# Patient Record
Sex: Female | Born: 1965 | Race: Black or African American | Hispanic: No | Marital: Married | Smoking: Current every day smoker
Health system: Southern US, Community
[De-identification: ages and names within clinical notes are randomized; demographics above are authoritative.]

## PROBLEM LIST (undated history)

## (undated) DIAGNOSIS — J40 Bronchitis, not specified as acute or chronic: Secondary | ICD-10-CM

## (undated) HISTORY — PX: TUBAL LIGATION: SHX77

---

## 2010-02-28 ENCOUNTER — Ambulatory Visit: Payer: Self-pay | Admitting: Diagnostic Radiology

## 2010-02-28 ENCOUNTER — Emergency Department (HOSPITAL_BASED_OUTPATIENT_CLINIC_OR_DEPARTMENT_OTHER): Admission: EM | Admit: 2010-02-28 | Discharge: 2010-02-28 | Payer: Self-pay | Admitting: Emergency Medicine

## 2011-05-10 ENCOUNTER — Emergency Department (INDEPENDENT_AMBULATORY_CARE_PROVIDER_SITE_OTHER): Payer: BC Managed Care – PPO

## 2011-05-10 ENCOUNTER — Emergency Department (HOSPITAL_BASED_OUTPATIENT_CLINIC_OR_DEPARTMENT_OTHER)
Admission: EM | Admit: 2011-05-10 | Discharge: 2011-05-10 | Disposition: A | Discharge status: Home / Self Care | Payer: BC Managed Care – PPO | Attending: Emergency Medicine | Admitting: Emergency Medicine

## 2011-05-10 ENCOUNTER — Encounter: Payer: Self-pay | Admitting: *Deleted

## 2011-05-10 DIAGNOSIS — E119 Type 2 diabetes mellitus without complications: Secondary | ICD-10-CM | POA: Insufficient documentation

## 2011-05-10 DIAGNOSIS — R059 Cough, unspecified: Secondary | ICD-10-CM

## 2011-05-10 DIAGNOSIS — R0602 Shortness of breath: Secondary | ICD-10-CM

## 2011-05-10 DIAGNOSIS — R05 Cough: Secondary | ICD-10-CM

## 2011-05-10 DIAGNOSIS — J40 Bronchitis, not specified as acute or chronic: Secondary | ICD-10-CM

## 2011-05-10 MED ORDER — AZITHROMYCIN 250 MG PO TABS
500.0000 mg | ORAL_TABLET | Freq: Once | ORAL | Status: AC
  Administered 2011-05-10: 500 mg via ORAL
  Filled 2011-05-10: qty 2

## 2011-05-10 MED ORDER — AZITHROMYCIN 250 MG PO TABS: ORAL_TABLET | ORAL | Status: AC

## 2011-05-10 MED ORDER — ALBUTEROL SULFATE HFA 108 (90 BASE) MCG/ACT IN AERS: 2.0000 | INHALATION_SPRAY | RESPIRATORY_TRACT | Status: AC | PRN

## 2011-05-10 MED ORDER — IPRATROPIUM BROMIDE 0.02 % IN SOLN
0.5000 mg | Freq: Once | RESPIRATORY_TRACT | Status: AC
  Administered 2011-05-10: 0.5 mg via RESPIRATORY_TRACT

## 2011-05-10 MED ORDER — ALBUTEROL SULFATE (5 MG/ML) 0.5% IN NEBU
5.0000 mg | INHALATION_SOLUTION | Freq: Once | RESPIRATORY_TRACT | Status: AC
  Administered 2011-05-10: 5 mg via RESPIRATORY_TRACT

## 2011-05-10 MED ORDER — IPRATROPIUM BROMIDE 0.02 % IN SOLN
RESPIRATORY_TRACT | Status: AC
  Administered 2011-05-10: 0.5 mg via RESPIRATORY_TRACT
  Filled 2011-05-10: qty 2.5

## 2011-05-10 MED ORDER — ALBUTEROL SULFATE (5 MG/ML) 0.5% IN NEBU
INHALATION_SOLUTION | RESPIRATORY_TRACT | Status: AC
  Filled 2011-05-10: qty 1

## 2011-05-10 NOTE — ED Provider Notes (Addendum)
History     CSN: [id number] Arrival date & time: 05/10/2011  7:14 AM   First MD Initiated Contact with Patient 05/10/11 [phone]      Chief Complaint  Patient presents with  . Shortness of Breath  . Cough    (Consider location/radiation/quality/duration/timing/severity/associated sxs/prior treatment) Patient is a 45 y.o. female presenting with shortness of breath and cough. The history is provided by the patient.  Shortness of Breath  Associated symptoms include cough and shortness of breath. Pertinent negatives include no chest pain and no fever.  Cough Associated symptoms include shortness of breath. Pertinent negatives include no chest pain, no headaches and no eye redness.  pt c/o sob and non prod cough x 1 week. Sob worse w coughing spells. No chest pain or discomfort. No sore throat, nasal congestion or other uri c/o. +smoker. No hx asthma or copd. No hx chf. No orthopnea/pna. No fever or chills. No leg pain or swelling. No dvt or pe hx. No fam hx premature cad. No assoc nv, or diaphoresis.   Past Medical History  Diagnosis Date  . Diabetes mellitus     Past Surgical History  Procedure Date  . Tubal ligation     No family history on file.  History  Substance Use Topics  . Smoking status: Not on file  . Smokeless tobacco: Not on file  . Alcohol Use: Yes    OB History    Grav Para Term Preterm Abortions TAB SAB Ect Mult Living                  Review of Systems  Constitutional: Negative for fever.  HENT: Negative for neck pain.   Eyes: Negative for redness.  Respiratory: Positive for cough and shortness of breath.   Cardiovascular: Negative for chest pain, palpitations and leg swelling.  Gastrointestinal: Negative for abdominal pain.  Genitourinary: Negative for dysuria.  Musculoskeletal: Negative for back pain.  Skin: Negative for rash.  Neurological: Negative for headaches.  Hematological: Does not bruise/bleed easily.  Psychiatric/Behavioral: Negative  for confusion.    Allergies  Review of patient's allergies indicates not on file.  Home Medications  No current outpatient prescriptions on file.  BP 156/80  Pulse 104  Temp(Src) 97.8 F (36.6 C) (Oral)  Resp 20  SpO2 99%  Physical Exam  Nursing note and vitals reviewed. Constitutional: She appears well-developed and well-nourished. No distress.  HENT:  Mouth/Throat: Oropharynx is clear and moist.  Eyes: Conjunctivae are normal. No scleral icterus.  Neck: Neck supple. No tracheal deviation present.  Cardiovascular: Normal rate, regular rhythm and intact distal pulses.  Exam reveals no gallop and no friction rub.   No murmur heard. Pulmonary/Chest: Effort normal. No respiratory distress.       Faint exp wheeze  Abdominal: Soft. Normal appearance. She exhibits no distension. There is no tenderness.  Musculoskeletal: She exhibits no edema and no tenderness.  Neurological: She is alert.  Skin: Skin is warm and dry. No rash noted.  Psychiatric: She has a normal mood and affect.    ED Course  Procedures (including critical care time)  Labs Reviewed - No data to display No results found.  No results found for this or any previous visit. Dg Chest 2 View  05/10/2011  *RADIOLOGY REPORT*  Clinical Data: Cough, shortness of breath  CHEST - 2 VIEW  Comparison: None.  Findings: Prominent heart size but normal vascularity.  Central bronchial thickening noted with perihilar and lower lobe ill- defined streaky interstitial opacities,  suspect related to chronic bronchitis versus bronchopneumonia or atypical pneumonia.  No effusions or pneumothorax.  Trachea is midline.  10 mm nodular density projects in the right upper chest along the anterior first rib, asymmetric compared to the left.  This could represent superimposed shadows versus a lung nodule.  No available comparison studies to document stability.  Also, there is a well-circumscribed 6 mm nodule in the right lower lobe medially which  could present a prominent vascular marking versus a granuloma.  No acute osseous finding.  IMPRESSION: Perihilar and bibasilar streaky interstitial opacities, suspect chronic bronchitis versus bronchopneumonia or atypical pneumonia.  Right upper chest 10 mm nodular density versus lung nodule. Recommend short-term non emergent follow-up chest CT.  Right lower lobe prominent vascular marking versus granuloma.  Original Report Authenticated By: Judie Petit. Ruel Favors, M.D.     No diagnosis found.    MDM  Nursing notes reviewed. Cxr. Albuterol and atrovent neb tx.     Recheck no increased wob. No wheezing.   Discussed xray with patient including right upper lung nodule/density and the need to follow up with primary care doctor in next couple weeks for outpt ct and further evaluation to exclude mass/cancer.     Suzi Roots, MD 05/10/11 1610  Suzi Roots, MD 05/10/11 [phone]

## 2011-05-10 NOTE — ED Notes (Signed)
Pt reports shortness of breath and productive cough for one week, worse today.

## 2011-05-11 ENCOUNTER — Emergency Department (HOSPITAL_BASED_OUTPATIENT_CLINIC_OR_DEPARTMENT_OTHER)
Admission: EM | Admit: 2011-05-11 | Discharge: 2011-05-11 | Disposition: A | Discharge status: Home / Self Care | Payer: BC Managed Care – PPO | Attending: Emergency Medicine | Admitting: Emergency Medicine

## 2011-05-11 ENCOUNTER — Encounter (HOSPITAL_BASED_OUTPATIENT_CLINIC_OR_DEPARTMENT_OTHER): Payer: Self-pay | Admitting: Emergency Medicine

## 2011-05-11 DIAGNOSIS — E119 Type 2 diabetes mellitus without complications: Secondary | ICD-10-CM | POA: Insufficient documentation

## 2011-05-11 DIAGNOSIS — R05 Cough: Secondary | ICD-10-CM | POA: Insufficient documentation

## 2011-05-11 DIAGNOSIS — R0602 Shortness of breath: Secondary | ICD-10-CM | POA: Insufficient documentation

## 2011-05-11 DIAGNOSIS — R059 Cough, unspecified: Secondary | ICD-10-CM | POA: Insufficient documentation

## 2011-05-11 DIAGNOSIS — J40 Bronchitis, not specified as acute or chronic: Secondary | ICD-10-CM | POA: Insufficient documentation

## 2011-05-11 MED ORDER — ALBUTEROL SULFATE (5 MG/ML) 0.5% IN NEBU
5.0000 mg | INHALATION_SOLUTION | Freq: Once | RESPIRATORY_TRACT | Status: AC
  Administered 2011-05-11: 5 mg via RESPIRATORY_TRACT
  Filled 2011-05-11: qty 1

## 2011-05-11 NOTE — ED Provider Notes (Signed)
History     CSN: [id number] Arrival date & time: 05/11/2011 12:23 AM   First MD Initiated Contact with Patient 05/11/11 0101      Chief Complaint  Patient presents with  . Cough  . Shortness of Breath    (Consider location/radiation/quality/duration/timing/severity/associated sxs/prior treatment) HPI Comments: Pleasant 45 year old female with history of recent visit for bronchitis presents with ongoing shortness of breath, cough. She states that this was gradual onset, persistent over the week, much better during the day but gets worse at night when she lays down to go to sleep. She denies swelling, fever, vomiting but has a persistent productive cough. She was seen in the last 24 hours and given Zithromax, albuterol inhaler and encouraged to use a cough medicine which she states she has been using but has not seen significant improvement at this time. Currently symptoms are moderate, nothing makes better but seems to be worse at night. She states that she continues to smoke cigarettes  Patient is a 45 y.o. female presenting with cough and shortness of breath. The history is provided by the patient, the spouse and medical records.  Cough Associated symptoms include shortness of breath.  Shortness of Breath  Associated symptoms include cough and shortness of breath.    Past Medical History  Diagnosis Date  . Diabetes mellitus     Past Surgical History  Procedure Date  . Tubal ligation     No family history on file.  History  Substance Use Topics  . Smoking status: Not on file  . Smokeless tobacco: Not on file  . Alcohol Use: Yes    OB History    Grav Para Term Preterm Abortions TAB SAB Ect Mult Living                  Review of Systems  Respiratory: Positive for cough and shortness of breath.   All other systems reviewed and are negative.    Allergies  Review of patient's allergies indicates no known allergies.  Home Medications   Current Outpatient Rx    Name Route Sig Dispense Refill  . ALBUTEROL SULFATE HFA 108 (90 BASE) MCG/ACT IN AERS Inhalation Inhale 2 puffs into the lungs every 4 (four) hours as needed for wheezing. 1 Inhaler 3  . AZITHROMYCIN 250 MG PO TABS  Take as directed 1 each 0    BP 167/84  Pulse 101  Temp(Src) 97.8 F (36.6 C) (Oral)  Resp 20  SpO2 100%  LMP 04/17/2011  Physical Exam  Nursing note and vitals reviewed. Constitutional: She appears well-developed and well-nourished. No distress.  HENT:  Head: Normocephalic and atraumatic.  Mouth/Throat: Oropharynx is clear and moist. No oropharyngeal exudate.  Eyes: Conjunctivae and EOM are normal. Pupils are equal, round, and reactive to light. Right eye exhibits no discharge. Left eye exhibits no discharge. No scleral icterus.  Neck: Normal range of motion. Neck supple. No JVD present. No thyromegaly present.  Cardiovascular: Normal rate, regular rhythm, normal heart sounds and intact distal pulses.  Exam reveals no gallop and no friction rub.   No murmur heard. Pulmonary/Chest: Effort normal and breath sounds normal. No respiratory distress. She has no wheezes. She has no rales.  Abdominal: Soft. Bowel sounds are normal. She exhibits no distension and no mass. There is no tenderness.  Musculoskeletal: Normal range of motion. She exhibits no edema and no tenderness.  Lymphadenopathy:    She has no cervical adenopathy.  Neurological: She is alert. Coordination normal.  Skin: Skin is  warm and dry. No rash noted. No erythema.  Psychiatric: She has a normal mood and affect. Her behavior is normal.    ED Course  Procedures (including critical care time)    1. Bronchitis       MDM  No abnormal lung findings, oxygen saturation 100% on room air, otherwise patient appears well though she is having cyclic coughing fits. She is on the appropriate medications for home but would benefit from an albuterol nebulizer treatment here. I again encouraged close followup for  abnormal chest x-ray findings and ongoing bronchitis symptoms.        Vida Roller, MD 05/11/11 [phone]

## 2011-05-11 NOTE — ED Notes (Signed)
Pt was here already for the same chief complaint of bronchitis. Pt was given an HHN tx, had a chest X-Ray and sent home with MDI (Albuterol) and some antibiotics but state that they are not working and is here for the same thing and states that she still feels [facility] and RX is not working for her none of them.

## 2011-05-11 NOTE — ED Notes (Signed)
Pt c/o cough and shob. Pt states she can't sleep tonight due to shob, however family reports pt slept all day without difficulty. Pt was seen here this morning for same and given zithromax and albuterol inhaler.

## 2011-08-15 ENCOUNTER — Other Ambulatory Visit: Payer: Self-pay

## 2011-08-15 ENCOUNTER — Emergency Department (INDEPENDENT_AMBULATORY_CARE_PROVIDER_SITE_OTHER): Payer: BC Managed Care – PPO

## 2011-08-15 ENCOUNTER — Encounter (HOSPITAL_BASED_OUTPATIENT_CLINIC_OR_DEPARTMENT_OTHER): Payer: Self-pay | Admitting: *Deleted

## 2011-08-15 ENCOUNTER — Ambulatory Visit (HOSPITAL_BASED_OUTPATIENT_CLINIC_OR_DEPARTMENT_OTHER): Admission: RE | Admit: 2011-08-15 | Payer: BC Managed Care – PPO | Source: Ambulatory Visit

## 2011-08-15 ENCOUNTER — Emergency Department (HOSPITAL_BASED_OUTPATIENT_CLINIC_OR_DEPARTMENT_OTHER)
Admission: EM | Admit: 2011-08-15 | Discharge: 2011-08-15 | Disposition: A | Discharge status: Home / Self Care | Payer: BC Managed Care – PPO | Attending: Emergency Medicine | Admitting: Emergency Medicine

## 2011-08-15 DIAGNOSIS — R0602 Shortness of breath: Secondary | ICD-10-CM | POA: Insufficient documentation

## 2011-08-15 DIAGNOSIS — M79609 Pain in unspecified limb: Secondary | ICD-10-CM

## 2011-08-15 DIAGNOSIS — E119 Type 2 diabetes mellitus without complications: Secondary | ICD-10-CM | POA: Insufficient documentation

## 2011-08-15 DIAGNOSIS — M7989 Other specified soft tissue disorders: Secondary | ICD-10-CM | POA: Insufficient documentation

## 2011-08-15 DIAGNOSIS — R799 Abnormal finding of blood chemistry, unspecified: Secondary | ICD-10-CM

## 2011-08-15 DIAGNOSIS — E010 Iodine-deficiency related diffuse (endemic) goiter: Secondary | ICD-10-CM

## 2011-08-15 DIAGNOSIS — R918 Other nonspecific abnormal finding of lung field: Secondary | ICD-10-CM | POA: Insufficient documentation

## 2011-08-15 DIAGNOSIS — E049 Nontoxic goiter, unspecified: Secondary | ICD-10-CM | POA: Insufficient documentation

## 2011-08-15 DIAGNOSIS — R609 Edema, unspecified: Secondary | ICD-10-CM | POA: Insufficient documentation

## 2011-08-15 DIAGNOSIS — D649 Anemia, unspecified: Secondary | ICD-10-CM | POA: Insufficient documentation

## 2011-08-15 HISTORY — DX: Bronchitis, not specified as acute or chronic: J40

## 2011-08-15 LAB — DIFFERENTIAL
Basophils Absolute: 0.1 10*3/uL (ref 0.0–0.1)
Eosinophils Absolute: 0.1 10*3/uL (ref 0.0–0.7)
Lymphs Abs: 1.9 10*3/uL (ref 0.7–4.0)
Monocytes Absolute: 0.4 10*3/uL (ref 0.1–1.0)
Neutrophils Relative %: 56 % (ref 43–77)

## 2011-08-15 LAB — BASIC METABOLIC PANEL
BUN: 19 mg/dL (ref 6–23)
Calcium: 9.4 mg/dL (ref 8.4–10.5)
Creatinine, Ser: 0.9 mg/dL (ref 0.50–1.10)
GFR calc Af Amer: 87 mL/min — ABNORMAL LOW (ref >90)
GFR calc non Af Amer: 75 mL/min — ABNORMAL LOW (ref >90)
Glucose, Bld: 170 mg/dL — ABNORMAL HIGH (ref 70–99)
Potassium: 4 mEq/L (ref 3.5–5.1)

## 2011-08-15 LAB — CBC
MCH: 19.6 pg — ABNORMAL LOW (ref 26.0–34.0)
MCHC: 28.8 g/dL — ABNORMAL LOW (ref 30.0–36.0)
MCV: 68 fL — ABNORMAL LOW (ref 78.0–100.0)
Platelets: 481 10*3/uL — ABNORMAL HIGH (ref 150–400)
RBC: 4.03 MIL/uL (ref 3.87–5.11)
RDW: 17.1 % — ABNORMAL HIGH (ref 11.5–15.5)

## 2011-08-15 MED ORDER — IOHEXOL 350 MG/ML SOLN
80.0000 mL | Freq: Once | INTRAVENOUS | Status: AC | PRN
  Administered 2011-08-15: 80 mL via INTRAVENOUS

## 2011-08-15 MED ORDER — ALBUTEROL SULFATE HFA 108 (90 BASE) MCG/ACT IN AERS
INHALATION_SPRAY | RESPIRATORY_TRACT | Status: AC
  Administered 2011-08-15: 05:00:00
  Filled 2011-08-15: qty 6.7

## 2011-08-15 NOTE — ED Provider Notes (Signed)
History     CSN: [id number]  Arrival date & time 08/15/11  0102   First MD Initiated Contact with Patient 08/15/11 0114      Chief Complaint  Patient presents with  . Leg Pain  . Shortness of Breath    (Consider location/radiation/quality/duration/timing/severity/associated sxs/prior treatment) HPI  Patient complaining of shortness of breath for 2 weeks. She states that this comes and goes and seems to be worse with exertion. She states that she has had treatment from here with an inhaler for bronchitis. She quit smoking in November. She has not had any cough, fever, or chest pain. Patient also complains of some pain and swelling in her right lower extremity. She states this has been present for a week. She denies any acute onset of this. She states she has not had any blood clots in the past. She denies history of trauma or travel.  Past Medical History  Diagnosis Date  . Diabetes mellitus   . Bronchitis     Past Surgical History  Procedure Date  . Tubal ligation   . Tubal ligation   . Cesarean section     No family history on file.  History  Substance Use Topics  . Smoking status: Former Smoker -- 1.0 packs/day    Quit date: 05/10/2011  . Smokeless tobacco: Never Used  . Alcohol Use: Yes    OB History    Grav Para Term Preterm Abortions TAB SAB Ect Mult Living                  Review of Systems  All other systems reviewed and are negative.    Allergies  Review of patient's allergies indicates no known allergies.  Home Medications   Current Outpatient Rx  Name Route Sig Dispense Refill  . IBUPROFEN 200 MG PO TABS Oral Take 800 mg by mouth every 6 (six) hours as needed.    . ALBUTEROL SULFATE HFA 108 (90 BASE) MCG/ACT IN AERS Inhalation Inhale 2 puffs into the lungs every 4 (four) hours as needed for wheezing. 1 Inhaler 3    BP 176/86  Pulse 102  Temp(Src) 98.4 F (36.9 C) (Oral)  Resp 20  Ht 5\' 4"  (1.626 m)  Wt 175 lb (79.379 kg)  BMI 30.04  kg/m2  SpO2 100%  LMP 08/01/2011  Physical Exam  Nursing note and vitals reviewed. Constitutional: She is oriented to person, place, and time. She appears well-developed and well-nourished.  HENT:  Head: Normocephalic and atraumatic.  Right Ear: External ear normal.  Left Ear: External ear normal.  Nose: Nose normal.  Mouth/Throat: Oropharynx is clear and moist.  Eyes: Conjunctivae and EOM are normal. Pupils are equal, round, and reactive to light.  Neck: Normal range of motion. Neck supple.  Cardiovascular: Normal rate, regular rhythm and normal heart sounds.   Pulmonary/Chest: Effort normal and breath sounds normal.  Abdominal: Soft. Bowel sounds are normal.  Musculoskeletal:       Bilateral ankle edema. No tenderness or warmth noted of either half or knee or thigh.  Neurological: She is alert and oriented to person, place, and time. She has normal reflexes.  Skin: Skin is warm and dry.  Psychiatric: She has a normal mood and affect.    ED Course  Procedures (including critical care time)  Date: 08/15/2011  Rate: 98  Rhythm: normal sinus rhythm  QRS Axis: normal  Intervals: normal  ST/T Wave abnormalities: normal  Conduction Disutrbances:none  Narrative Interpretation:   Old EKG Reviewed:  none available   Labs Reviewed  CBC - Abnormal; Notable for the following:    Hemoglobin 7.9 (*) REPEATED TO VERIFY   HCT 27.4 (*)    MCV 68.0 (*)    MCH 19.6 (*)    MCHC 28.8 (*)    RDW 17.1 (*)    Platelets 481 (*)    All other components within normal limits  BASIC METABOLIC PANEL - Abnormal; Notable for the following:    Glucose, Bld 170 (*)    GFR calc non Af Amer 75 (*)    GFR calc Af Amer 87 (*)    All other components within normal limits  D-DIMER, QUANTITATIVE - Abnormal; Notable for the following:    D-Dimer, Quant 0.84 (*)    All other components within normal limits  DIFFERENTIAL   Dg Chest 2 View  08/15/2011  *RADIOLOGY REPORT*  Clinical Data: Shortness of  breath; leg pain.  History of diabetes.  CHEST - 2 VIEW  Comparison: Chest radiograph performed 05/10/2011  Findings: The lungs are well-aerated.  Mild right perihilar opacity could reflect a mild infectious process, similar in appearance to the prior study.  Mild asymmetric edema could have a similar appearance.  There is no evidence of pleural effusion or pneumothorax.  The heart is normal in size; the mediastinal contour is within normal limits.  No acute osseous abnormalities are seen.  IMPRESSION: Mild right perihilar opacity could reflect a persistent mild infectious process; this is similar in appearance to the prior study.  Mild asymmetric edema could have a similar appearance.  Original Report Authenticated By: Tonia Ghent, M.D.   Ct Angio Chest W/cm &/or Wo Cm  08/15/2011  *RADIOLOGY REPORT*  Clinical Data: Shortness of breath, leg pain and elevated D-dimer. Leg swelling.  History of diabetes.  CT ANGIOGRAPHY CHEST  Technique:  Multidetector CT imaging of the chest using the standard protocol during bolus administration of intravenous contrast. Multiplanar reconstructed images including MIPs were obtained and reviewed to evaluate the vascular anatomy.  Contrast: 80mL OMNIPAQUE IOHEXOL 350 MG/ML IV SOLN  Comparison: Chest radiograph performed earlier today at 01:53 a.m.  Findings: There is no evidence of pulmonary embolus.  There is mild interstitial prominence at the lung bases, without significant pulmonary edema.  The lungs are otherwise grossly clear.  There is no evidence of significant focal consolidation, pleural effusion or pneumothorax.  No masses are identified; no abnormal focal contrast enhancement is seen.  Prominent right hilar and subcarinal nodes are seen, measuring up to 1.6 cm in short axis.  These are difficult to distinguish from surrounding soft tissues.  Smaller superior mediastinal nodes appear normal in size.  No axillary lymphadenopathy is seen.  There is mild diffuse  enlargement of the thyroid gland; suggest clinical correlation for thyroid goiter.  The visualized portions of the liver and spleen are unremarkable.  No acute osseous abnormalities are seen.  IMPRESSION:  1.  No evidence of pulmonary embolus. 2.  Mild interstitial prominence noted at the lung apices, without significant pulmonary edema. 3.  Prominent right hilar and subcarinal nodes, measuring up to 1.6 cm in short axis; these are of uncertain significance. 4.  Mild diffuse enlargement of the thyroid gland; suggest correlation with labs for thyroid goiter.  Original Report Authenticated By: Tonia Ghent, M.D.     No diagnosis found.    MDM  1- dyspnea-patient with normal oxygen saturations. She has not had any evidence of pulmonary embolism on CT MG of. Her EKG does not  show any evidence of ischemia. This is likely secondary to her anemia. 2 anemia patient states she does have a history of anemia but is not sure what it has been in the past. She has not been taking any medication for this. She denies any dark tarry stools or rectal bleeding. She does still currently menstruating and states that her menses are somewhat heavy patient is advised to begin oral iron and to have close followup for this. 3 enlarged thyroid seen on CT scan patient is referred to primary care for followup. 4 enlarged lymph nodes patient is referred to primary care for followup and is also advised.        Hilario Quarry, MD 08/15/11 [phone]

## 2011-08-15 NOTE — ED Notes (Signed)
Patient was instructed to have an ultrasound to r/o DVT. She was offered an appt at [facility] for tomorrow, but stated that she is going to go to [facility] instead. Upon d/c the patient stated that we haven't done anything for her. She refused to sign her discharge papers stating that she "still doesn't even know whats wrong with her".

## 2011-08-15 NOTE — ED Notes (Signed)
Patient transported to CT 

## 2011-08-15 NOTE — ED Notes (Signed)
Pt reports right leg pain x 1 week- SOB x 2 weeks

## 2014-06-24 ENCOUNTER — Emergency Department (HOSPITAL_BASED_OUTPATIENT_CLINIC_OR_DEPARTMENT_OTHER)
Admission: EM | Admit: 2014-06-24 | Discharge: 2014-06-24 | Disposition: A | Discharge status: Home / Self Care | Payer: BLUE CROSS/BLUE SHIELD | Attending: Emergency Medicine | Admitting: Emergency Medicine

## 2014-06-24 ENCOUNTER — Emergency Department (HOSPITAL_BASED_OUTPATIENT_CLINIC_OR_DEPARTMENT_OTHER): Payer: BLUE CROSS/BLUE SHIELD

## 2014-06-24 ENCOUNTER — Encounter (HOSPITAL_BASED_OUTPATIENT_CLINIC_OR_DEPARTMENT_OTHER): Payer: Self-pay

## 2014-06-24 DIAGNOSIS — E119 Type 2 diabetes mellitus without complications: Secondary | ICD-10-CM | POA: Diagnosis not present

## 2014-06-24 DIAGNOSIS — J40 Bronchitis, not specified as acute or chronic: Secondary | ICD-10-CM | POA: Diagnosis not present

## 2014-06-24 DIAGNOSIS — R0602 Shortness of breath: Secondary | ICD-10-CM | POA: Diagnosis present

## 2014-06-24 DIAGNOSIS — Z79899 Other long term (current) drug therapy: Secondary | ICD-10-CM | POA: Diagnosis not present

## 2014-06-24 DIAGNOSIS — I10 Essential (primary) hypertension: Secondary | ICD-10-CM | POA: Diagnosis not present

## 2014-06-24 LAB — BASIC METABOLIC PANEL
ANION GAP: 8 (ref 5–15)
BUN: 11 mg/dL (ref 6–23)
CO2: 22 mmol/L (ref 19–32)
Calcium: 8.6 mg/dL (ref 8.4–10.5)
Chloride: 105 mEq/L (ref 96–112)
Creatinine, Ser: 0.64 mg/dL (ref 0.50–1.10)
GFR calc non Af Amer: >90 mL/min (ref >90)
GLUCOSE: 204 mg/dL — AB (ref 70–99)
Potassium: 3.7 mmol/L (ref 3.5–5.1)
SODIUM: 135 mmol/L (ref 135–145)

## 2014-06-24 MED ORDER — IPRATROPIUM-ALBUTEROL 0.5-2.5 (3) MG/3ML IN SOLN
3.0000 mL | Freq: Once | RESPIRATORY_TRACT | Status: AC
  Administered 2014-06-24: 3 mL via RESPIRATORY_TRACT
  Filled 2014-06-24: qty 3

## 2014-06-24 MED ORDER — ALBUTEROL SULFATE HFA 108 (90 BASE) MCG/ACT IN AERS
2.0000 | INHALATION_SPRAY | RESPIRATORY_TRACT | Status: DC | PRN
Start: 2014-06-24 — End: 2014-06-24
  Administered 2014-06-24: 2 via RESPIRATORY_TRACT
  Filled 2014-06-24: qty 6.7

## 2014-06-24 MED ORDER — IPRATROPIUM BROMIDE 0.02 % IN SOLN: 0.5000 mg | Freq: Once | RESPIRATORY_TRACT | Status: DC

## 2014-06-24 MED ORDER — ALBUTEROL SULFATE (2.5 MG/3ML) 0.083% IN NEBU: 5.0000 mg | INHALATION_SOLUTION | Freq: Once | RESPIRATORY_TRACT | Status: DC

## 2014-06-24 MED ORDER — ALBUTEROL SULFATE (2.5 MG/3ML) 0.083% IN NEBU
2.5000 mg | INHALATION_SOLUTION | Freq: Once | RESPIRATORY_TRACT | Status: AC
Start: 2014-06-24 — End: 2014-06-24
  Administered 2014-06-24: 2.5 mg via RESPIRATORY_TRACT
  Filled 2014-06-24: qty 3

## 2014-06-24 NOTE — ED Provider Notes (Addendum)
CSN: [id number]     Arrival date & time 06/24/14  0807 History   First MD Initiated Contact with Patient 06/24/14 [phone]     Chief Complaint  Patient presents with  . Shortness of Breath     (Consider location/radiation/quality/duration/timing/severity/associated sxs/prior Treatment) Patient is a 49 y.o. female presenting with shortness of breath. The history is provided by the patient.  Shortness of Breath Severity:  Moderate Onset quality:  Gradual Duration:  1 week Timing:  Constant Progression:  Worsening Chronicity:  Recurrent Context: URI   Relieved by:  Sitting up Exacerbated by: lying down. Ineffective treatments:  None tried Associated symptoms: cough, sputum production and wheezing   Associated symptoms: no abdominal pain, no chest pain, no fever, no headaches, no sore throat and no vomiting   Risk factors: tobacco use   Risk factors: no hx of PE/DVT and no prolonged immobilization     Past Medical History  Diagnosis Date  . Diabetes mellitus   . Bronchitis    Past Surgical History  Procedure Laterality Date  . Tubal ligation    . Tubal ligation    . Cesarean section     No family history on file. History  Substance Use Topics  . Smoking status: Current Every Day Smoker -- 0.50 packs/day    Types: Cigarettes    Last Attempt to Quit: 05/10/2011  . Smokeless tobacco: Never Used  . Alcohol Use: Yes     Comment: 2 x week   OB History    No data available     Review of Systems  Constitutional: Negative for fever.  HENT: Negative for sore throat.   Respiratory: Positive for cough, sputum production, shortness of breath and wheezing.   Cardiovascular: Negative for chest pain.  Gastrointestinal: Negative for vomiting and abdominal pain.  Neurological: Negative for headaches.  All other systems reviewed and are negative.     Allergies  Review of patient's allergies indicates no known allergies.  Home Medications   Prior to Admission medications    Medication Sig Start Date End Date Taking? Authorizing Provider  albuterol (PROVENTIL HFA;VENTOLIN HFA) 108 (90 BASE) MCG/ACT inhaler Inhale 2 puffs into the lungs every 4 (four) hours as needed for wheezing. 05/10/11 05/09/12  Suzi RootsKevin E Steinl, MD  ibuprofen (ADVIL,MOTRIN) 200 MG tablet Take 800 mg by mouth every 6 (six) hours as needed.    Historical Provider, MD   BP 182/87 mmHg  Pulse 112  Temp(Src) 98.7 F (37.1 C) (Oral)  Resp 18  Ht 5\' 3"  (1.6 m)  Wt 180 lb (81.647 kg)  BMI 31.89 kg/m2  SpO2 95%  LMP 05/31/2014 Physical Exam  Constitutional: She is oriented to person, place, and time. She appears well-developed and well-nourished. No distress.  HENT:  Head: Normocephalic and atraumatic.  Mouth/Throat: Oropharynx is clear and moist.  Eyes: Conjunctivae and EOM are normal. Pupils are equal, round, and reactive to light.  Neck: Normal range of motion. Neck supple.  Cardiovascular: Normal rate, regular rhythm and intact distal pulses.   No murmur heard. Pulmonary/Chest: Effort normal and breath sounds normal. No respiratory distress. She has no wheezes. She has no rales.  Mild decreased breath sounds bilaterally  Abdominal: Soft. She exhibits no distension. There is no tenderness. There is no rebound and no guarding.  Musculoskeletal: Normal range of motion. She exhibits no edema or tenderness.  Neurological: She is alert and oriented to person, place, and time.  Skin: Skin is warm and dry. No rash noted. No erythema.  Psychiatric: She has a normal mood and affect. Her behavior is normal.  Nursing note and vitals reviewed.   ED Course  Procedures (including critical care time) Labs Review Labs Reviewed  BASIC METABOLIC PANEL - Abnormal; Notable for the following:    Glucose, Bld 204 (*)    All other components within normal limits    Imaging Review Dg Chest 2 View  06/24/2014   CLINICAL DATA:  Cough, congestion and shortness of breath for 1 week.  EXAM: CHEST  2 VIEW   COMPARISON:  PA and lateral chest and CT chest 08/15/2011.  FINDINGS: There is peribronchial thickening. No consolidative process, pneumothorax or effusion is identified. Heart size is normal.  IMPRESSION: Bronchitic change without focal process.   Electronically Signed   By: Drusilla Kanner M.D.   On: 06/24/2014 09:04     EKG Interpretation None      MDM   Final diagnoses:  SOB (shortness of breath)  Essential hypertension  Bronchitis    Pt with symptoms consistent with viral URI versus bronchitis with one week of productive cough and shortness of breath with lying down.  Well appearing here.  No signs of breathing difficulty  patient is a smoker. No signs of pharyngitis, otitis or abnormal abdominal findings.  Patient states she had a prior history of diabetes but she controls it with diet however her blood sure has not been checked for some time. Also since she has been here she has been noted to be hypertensive.  Low suspicion for CHF as the cause of patient's symptoms today she has no signs of fluid overload, chest pain or prior history of the same. CXR with signs of bronchitic changes. Discussed with patient follow-up with her primary physician for evaluation of blood pressure and hemoglobin A1c for diabetes. Currently blood sugar today is 204 nonfasting.  Patient feeling much better after albuterol and Atrovent. Sent home with a new inhaler.      Gwyneth Sprout, MD 06/24/14 [id number]  Gwyneth Sprout, MD 06/24/14 [phone]

## 2014-06-24 NOTE — ED Notes (Signed)
SHOB on rest and exertion x 1 week "maybe longer".  Productive cough x 1 week. Denies chest pain.

## 2014-06-24 NOTE — ED Notes (Signed)
Patient transported to X-ray 

## 2014-07-22 ENCOUNTER — Ambulatory Visit: Payer: BLUE CROSS/BLUE SHIELD | Admitting: Physician Assistant

## 2015-11-26 ENCOUNTER — Emergency Department (HOSPITAL_BASED_OUTPATIENT_CLINIC_OR_DEPARTMENT_OTHER)
Admission: EM | Admit: 2015-11-26 | Discharge: 2015-11-27 | Disposition: A | Discharge status: Home / Self Care | Payer: BLUE CROSS/BLUE SHIELD | Attending: Emergency Medicine | Admitting: Emergency Medicine

## 2015-11-26 ENCOUNTER — Encounter (HOSPITAL_BASED_OUTPATIENT_CLINIC_OR_DEPARTMENT_OTHER): Payer: Self-pay

## 2015-11-26 DIAGNOSIS — M79641 Pain in right hand: Secondary | ICD-10-CM | POA: Diagnosis present

## 2015-11-26 DIAGNOSIS — IMO0001 Reserved for inherently not codable concepts without codable children: Secondary | ICD-10-CM

## 2015-11-26 DIAGNOSIS — L03011 Cellulitis of right finger: Secondary | ICD-10-CM | POA: Insufficient documentation

## 2015-11-26 DIAGNOSIS — E119 Type 2 diabetes mellitus without complications: Secondary | ICD-10-CM | POA: Insufficient documentation

## 2015-11-26 DIAGNOSIS — F17210 Nicotine dependence, cigarettes, uncomplicated: Secondary | ICD-10-CM | POA: Insufficient documentation

## 2015-11-26 MED ORDER — LIDOCAINE HCL (PF) 1 % IJ SOLN
5.0000 mL | Freq: Once | INTRAMUSCULAR | Status: AC
  Administered 2015-11-26: 5 mL via INTRADERMAL
  Filled 2015-11-26: qty 5

## 2015-11-26 NOTE — ED Provider Notes (Signed)
CSN: [id number]     Arrival date & time 11/26/15  2103 History   First MD Initiated Contact with Patient 11/26/15 2221     Chief Complaint  Patient presents with  . Hand Pain     (Consider location/radiation/quality/duration/timing/severity/associated sxs/prior Treatment) HPI   Pt is a 50 y/o female with a history of DM who presents to the ED with right fourth finger pain for roughly one month. Pt states she noticed pain and swelling in her finger that has waxed and waned for a month but increased in pain yesterday. Pt states the pain is constant, throbbing, worse with touch and she has not taken anything for it. Pt states one month ago she was chewing on her finger and caused it to bleed. She denies numbness/tingling, weakness, fever, chills.   Patient also complains of rash in bilateral lower extremities. She states she experienced an itchy sensation with one month ago and scratched her skin. She scratched layers of skin off and caused excoriate areas that then scabbed and scarred. She states the ones that still have scabs are tender. She states they're no longer itchy.  Past Medical History  Diagnosis Date  . Diabetes mellitus   . Bronchitis    Past Surgical History  Procedure Laterality Date  . Tubal ligation    . Tubal ligation    . Cesarean section     No family history on file. Social History  Substance Use Topics  . Smoking status: Current Every Day Smoker -- 0.50 packs/day    Types: Cigarettes    Last Attempt to Quit: 05/10/2011  . Smokeless tobacco: Never Used  . Alcohol Use: Yes     Comment: 2 x week   OB History    No data available     Review of Systems  Constitutional: Negative for fever and chills.  Respiratory: Negative for shortness of breath.   Gastrointestinal: Negative for vomiting and abdominal pain.  Skin: Positive for rash and wound.      Allergies  Review of patient's allergies indicates no known allergies.  Home Medications   Prior to  Admission medications   Medication Sig Start Date End Date Taking? Authorizing Provider  albuterol (PROVENTIL HFA;VENTOLIN HFA) 108 (90 BASE) MCG/ACT inhaler Inhale 2 puffs into the lungs every 4 (four) hours as needed for wheezing. 05/10/11 05/09/12  Cathren LaineKevin Steinl, MD  ibuprofen (ADVIL,MOTRIN) 200 MG tablet Take 800 mg by mouth every 6 (six) hours as needed.    Historical Provider, MD   BP 156/94 mmHg  Pulse 97  Temp(Src) 98.6 F (37 C) (Oral)  Resp 18  Ht 5\' 5"  (1.651 m)  Wt 77.111 kg  BMI 28.29 kg/m2  SpO2 100%  LMP 11/09/2015 Physical Exam  Constitutional: She appears well-developed and well-nourished. No distress.  HENT:  Head: Normocephalic and atraumatic.  Eyes: Conjunctivae are normal.  Neck: Normal range of motion.  Cardiovascular:  Pulses:      Radial pulses are 2+ on the right side, and 2+ on the left side.  Pulmonary/Chest: Effort normal.  Musculoskeletal: Normal range of motion.  Examination of right fourth finger revealed edema surrounding the cuticle, TTP, fluctuance noted, no red streaking noted. Neurovascularly intact distally.  Neurological: She is alert. Coordination normal.  Skin: Skin is warm and dry. She is not diaphoretic.  Multiple scabbed areas noted to posterior left lower leg, no surrounding erythema, warmth, or edema. No signs of infection. Neurovascularly intact distally.  Psychiatric: She has a normal mood and affect.  Her behavior is normal.    ED Course  .Marland KitchenIncision and Drainage Date/Time: 11/27/2015 12:06 AM Performed by: Rhona Raider, Cristian Davitt L Authorized by: Mattie Marlin L Consent: Verbal consent obtained. Risks and benefits: risks, benefits and alternatives were discussed Consent given by: patient Patient understanding: patient states understanding of the procedure being performed Required items: required blood products, implants, devices, and special equipment available Patient identity confirmed: verbally with patient and arm band Time out:  Immediately prior to procedure a "time out" was called to verify the correct patient, procedure, equipment, support staff and site/side marked as required. Type: abscess Body area: upper extremity Location details: right ring finger Anesthesia: local infiltration Local anesthetic: lidocaine 1% without epinephrine Anesthetic total: 2 ml Patient sedated: no Scalpel size: 15 Incision type: single straight Incision depth: dermal Complexity: simple Drainage: purulent and  bloody Drainage amount: moderate Wound treatment: wound left open Packing material: none Patient tolerance: Patient tolerated the procedure well with no immediate complications   (including critical care time) Labs Review Labs Reviewed - No data to display  Imaging Review No results found. I have personally reviewed and evaluated these images and lab results as part of my medical decision-making.   EKG Interpretation None      MDM   Final diagnoses:  Paronychia of fourth finger, right   Patient with paronychia amenable to incision and drainage.  Abscess was not large enough to warrant packing or drain,  wound recheck in 2 days. Encouraged home warm soaks and flushing.  Mild signs of cellulitis of surrounding skin.  Will d/c to home.  No antibiotic therapy is indicated.   Rash of bilateral lower extremities is the result of patient scratching. No signs of infection. Patient states it is no longer itchy. No treatment indicated at this time. Instructed patient to follow-up with her primary care provider if symptoms worsen or areas do not heal.  Discussed strict return precautions. Patient was understanding the discharge instructions.    Jerre Simon, PA 11/27/15 0010  Paula Libra, MD 11/27/15 [phone]

## 2015-11-26 NOTE — Discharge Instructions (Signed)
Follow-up with your primary care provider in 2 days if symptoms do not improve. Keep the wound clean and dry.   Return to the emergency department if you experience increased pain, increased swelling, the tip your finger becomes hard, you have decreased sensation in your finger, fever, chills.  Paronychia Paronychia is an infection of the skin that surrounds a nail. It usually affects the skin around a fingernail, but it may also occur near a toenail. It often causes pain and swelling around the nail. This condition may come on suddenly or develop over a longer period. In some cases, a collection of pus (abscess) can form near or under the nail. Usually, paronychia is not serious and it clears up with treatment. CAUSES This condition may be caused by bacteria or fungi. It is commonly caused by either Streptococcus or Staphylococcus bacteria. The bacteria or fungi often cause the infection by getting into the affected area through an opening in the skin, such as a cut or a hangnail. RISK FACTORS This condition is more likely to develop in:  People who get their hands wet often, such as those who work as Fish farm managerdishwashers, bartenders, or nurses.  People who bite their fingernails or suck their thumbs.  People who trim their nails too short.  People who have hangnails or injured fingertips.  People who get manicures.  People who have diabetes. SYMPTOMS Symptoms of this condition include:  Redness and swelling of the skin near the nail.  Tenderness around the nail when you touch the area.  Pus-filled bumps under the cuticle. The cuticle is the skin at the base or sides of the nail.  Fluid or pus under the nail.  Throbbing pain in the area. DIAGNOSIS This condition is usually diagnosed with a physical exam. In some cases, a sample of pus may be taken from an abscess to be tested in a lab. This can help to determine what type of bacteria or fungi is causing the condition. TREATMENT Treatment  for this condition depends on the cause and severity of the condition. If the condition is mild, it may clear up on its own in a few days. Your health care provider may recommend soaking the affected area in warm water a few times a day. When treatment is needed, the options may include:  Antibiotic medicine, if the condition is caused by a bacterial infection.  Antifungal medicine, if the condition is caused by a fungal infection.  Incision and drainage, if an abscess is present. In this procedure, the health care provider will cut open the abscess so the pus can drain out. HOME CARE INSTRUCTIONS  Soak the affected area in warm water if directed to do so by your health care provider. You may be told to do this for 20 minutes, 2-3 times a day. Keep the area dry in between soakings.  Take medicines only as directed by your health care provider.  If you were prescribed an antibiotic medicine, finish all of it even if you start to feel better.  Keep the affected area clean.  Do not try to drain a fluid-filled bump yourself.  If you will be washing dishes or performing other tasks that require your hands to get wet, wear rubber gloves. You should also wear gloves if your hands might come in contact with irritating substances, such as cleaners or chemicals.  Follow your health care provider's instructions about:  Wound care.  Bandage (dressing) changes and removal. SEEK MEDICAL CARE IF:  Your symptoms get  worse or do not improve with treatment.  You have a fever or chills.  You have redness spreading from the affected area.  You have continued or increased fluid, blood, or pus coming from the affected area.  Your finger or knuckle becomes swollen or is difficult to move.   This information is not intended to replace advice given to you by your health care provider. Make sure you discuss any questions you have with your health care provider.   Document Released: 11/24/2000 Document  Revised: 10/15/2014 Document Reviewed: 05/08/2014 Elsevier Interactive Patient Education [employer]

## 2015-11-26 NOTE — ED Notes (Signed)
Pt c/o swelling and redness to nailbed of right ring finger for the last several weeks as well as a rash on her left lower leg for the last several months

## 2016-05-07 IMAGING — CR DG CHEST 2V
2 series · 2 of 2 positions shown · non-contrast
Comparison: PA and lateral chest and CT chest 08/15/2011.

CLINICAL DATA: Cough, congestion and shortness of breath for 1
week.

EXAM:
CHEST  2 VIEW

[w chest pa]
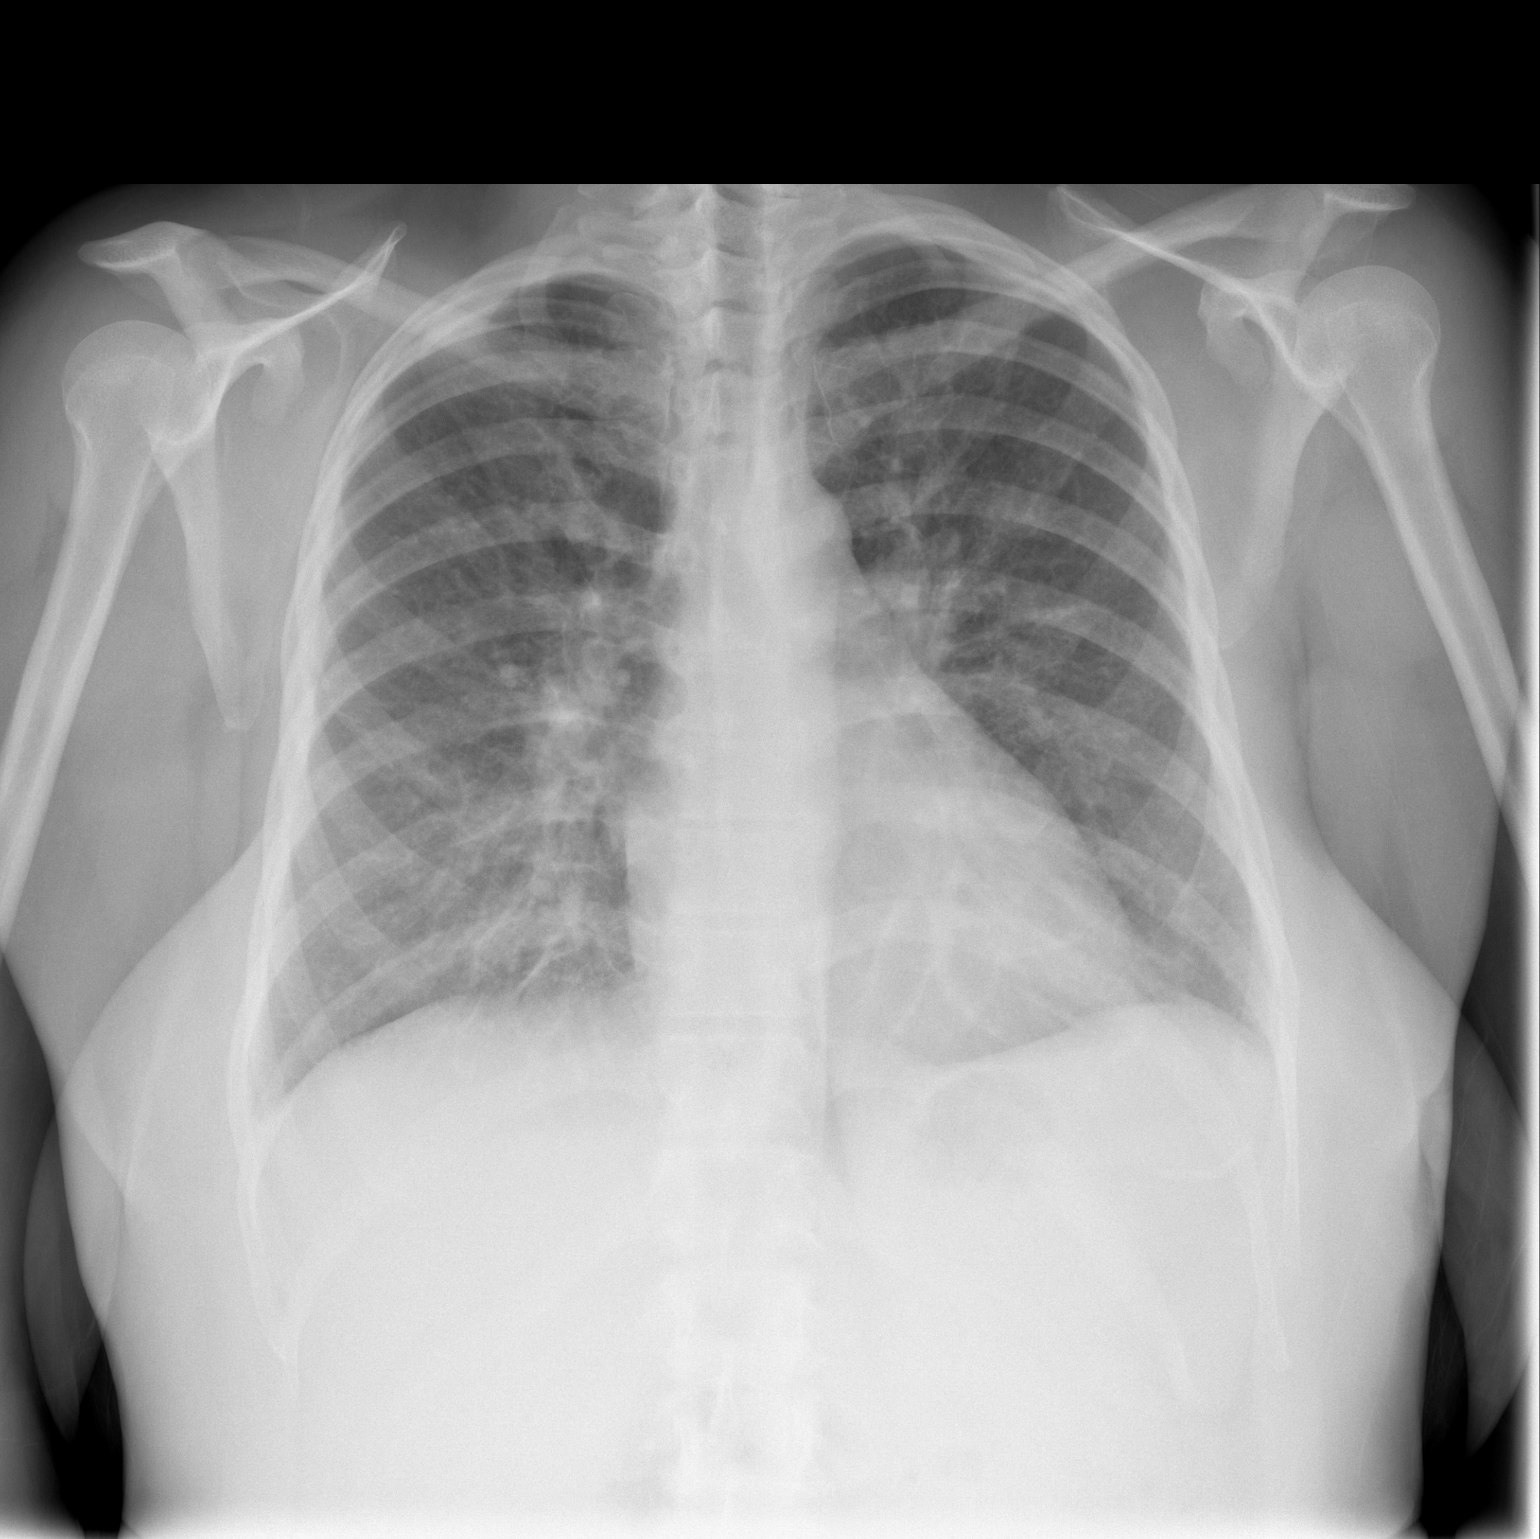

[w chest lat]
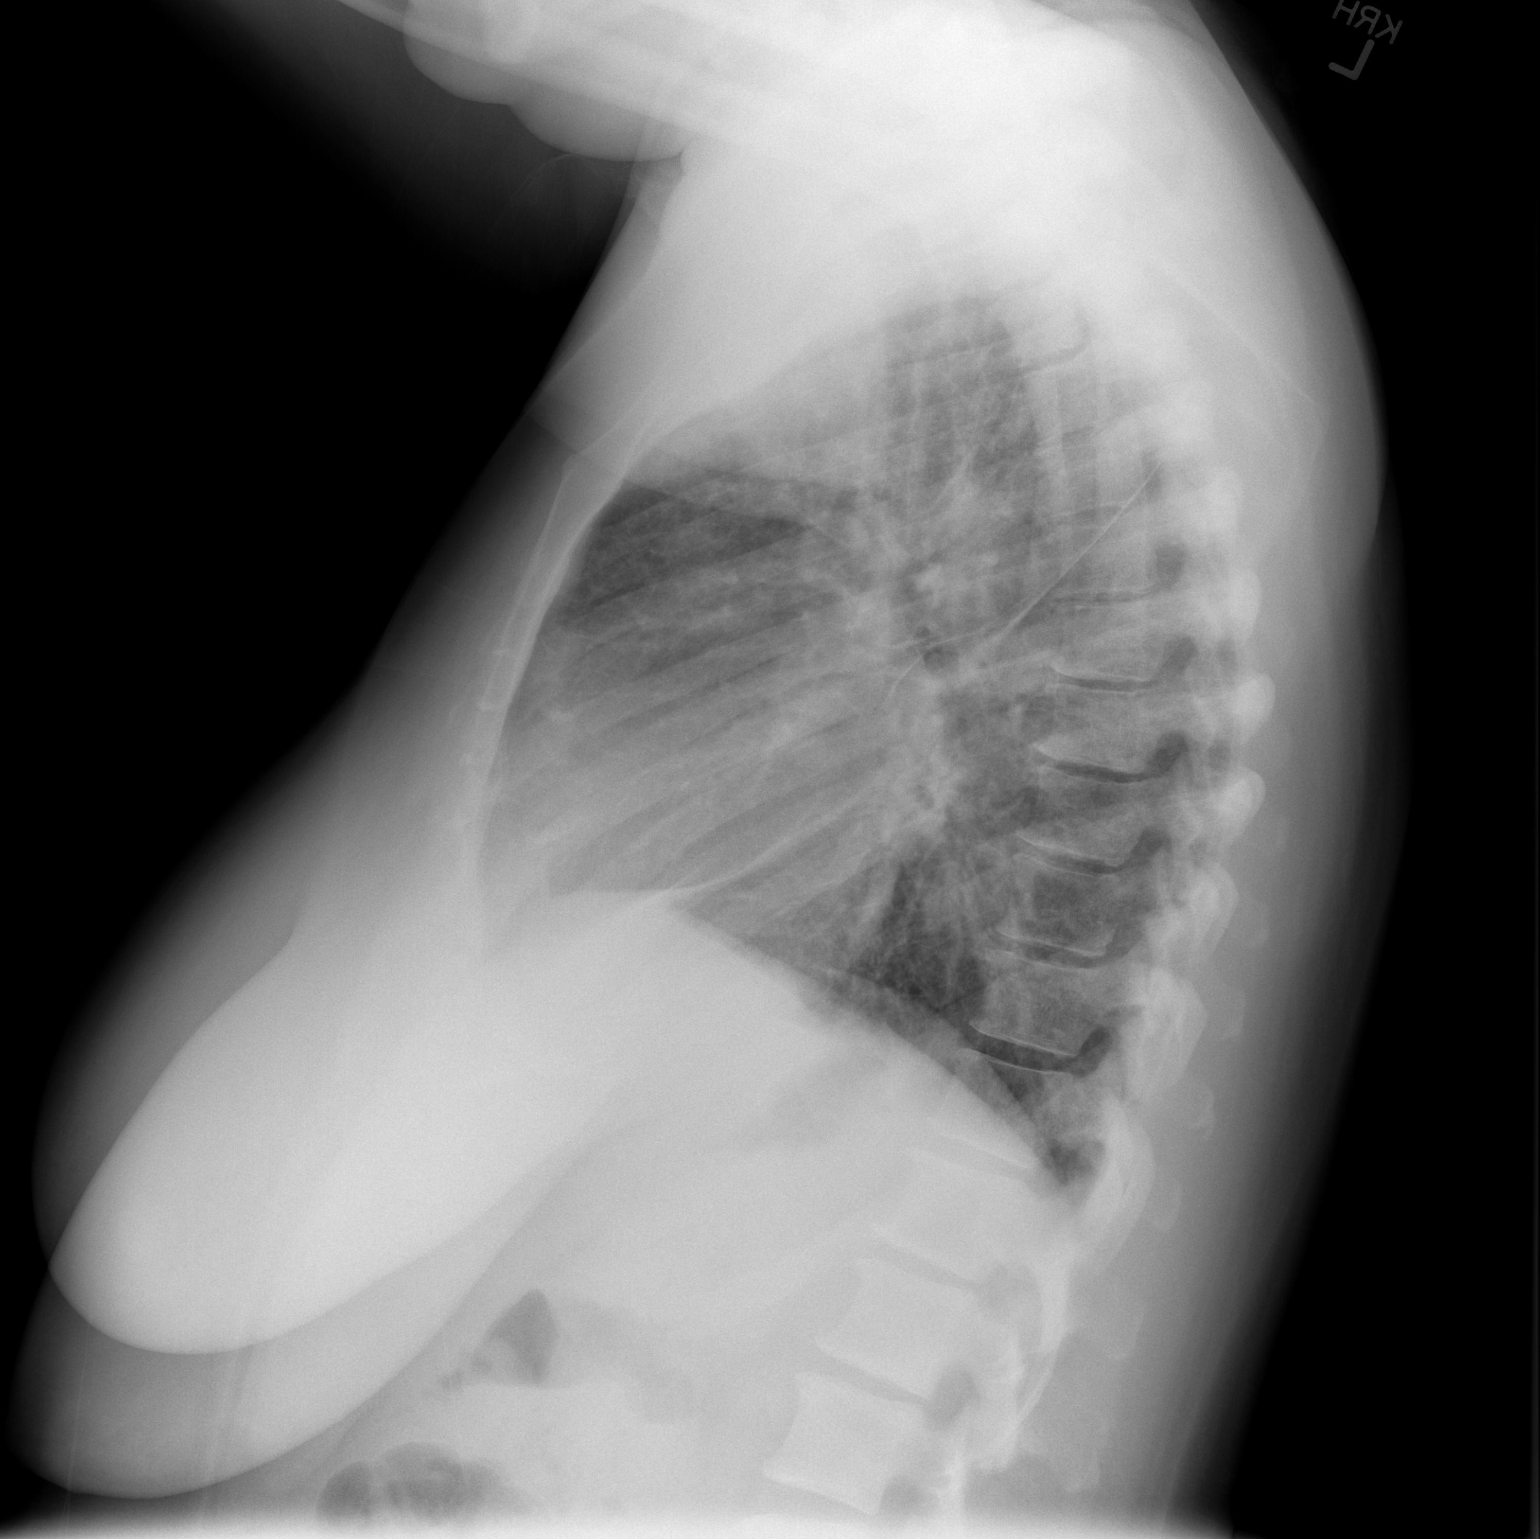

[2 of 2 positions shown; findings below may reference images not displayed]

FINDINGS: There is peribronchial thickening. No consolidative process,
pneumothorax or effusion is identified. Heart size is normal.
IMPRESSION: Bronchitic change without focal process.
